# Patient Record
Sex: Female | Born: 1965 | Race: White | Hispanic: Yes | Marital: Married
Health system: Southern US, Community
[De-identification: ages and names within clinical notes are randomized; demographics above are authoritative.]

---

## 2005-11-15 ENCOUNTER — Ambulatory Visit: Payer: Self-pay | Admitting: *Deleted

## 2005-11-15 ENCOUNTER — Ambulatory Visit: Payer: Self-pay

## 2007-05-29 ENCOUNTER — Ambulatory Visit: Payer: Self-pay

## 2011-05-03 ENCOUNTER — Ambulatory Visit: Payer: Self-pay

## 2012-08-07 ENCOUNTER — Ambulatory Visit: Payer: Self-pay

## 2014-07-29 ENCOUNTER — Ambulatory Visit: Payer: Self-pay

## 2016-10-25 ENCOUNTER — Ambulatory Visit (INDEPENDENT_AMBULATORY_CARE_PROVIDER_SITE_OTHER): Payer: Self-pay | Admitting: Physician Assistant

## 2017-04-18 ENCOUNTER — Encounter (INDEPENDENT_AMBULATORY_CARE_PROVIDER_SITE_OTHER): Payer: Self-pay

## 2017-04-18 ENCOUNTER — Encounter: Payer: Self-pay | Admitting: *Deleted

## 2017-04-18 ENCOUNTER — Ambulatory Visit
Admission: RE | Admit: 2017-04-18 | Discharge: 2017-04-18 | Disposition: A | Discharge status: Home / Self Care | Payer: Self-pay | Source: Ambulatory Visit | Attending: Oncology | Admitting: Oncology

## 2017-04-18 ENCOUNTER — Ambulatory Visit: Payer: Self-pay | Attending: Oncology | Admitting: *Deleted

## 2017-04-18 VITALS — BP 127/85 | HR 58 | Temp 95.8°F | Resp 18 | Ht 59.45 in | Wt 141.6 lb

## 2017-04-18 DIAGNOSIS — Z0000 Encounter for general adult medical examination without abnormal findings: Secondary | ICD-10-CM

## 2017-04-18 NOTE — Progress Notes (Signed)
Subjective:     Patient ID: Wendy Mcmillan, female   DOB: [birth year], 51 y.o.   MRN: [id number]  HPI   Review of Systems     Objective:   Physical Exam  Pulmonary/Chest: Right breast exhibits no inverted nipple, no mass, no nipple discharge, no skin change and no tenderness. Left breast exhibits no inverted nipple, no mass, no nipple discharge, no skin change and no tenderness. Breasts are symmetrical.       Assessment:     51 year old English speaking Hispanic female returns to [facility] for annual screening.  Clinical breast exam unremarkable.  Taught self breast awareness.  Last pap was completed in August 2018 with her primary care provider.  Complains of menopausal symptoms of hot flashes.  Hand-out on menopause given to patient.  She is to try OTC Black Cohosh or Jeffie Pollock since she does not have a family history of breast cancer.  If the the hot flashes are really bad she was encourage to discuss with her primary provider about a very low dose of an antidepressant.  States they are not that bad at this time.  Patient has been screened for eligibility.  She does not have any insurance, Medicare or Medicaid.  She also meets financial eligibility.  Hand-out given on the Affordable Care Act.    Plan:     Screening mammogram ordered.  Will follow-up per [facility] protocol.

## 2017-04-18 NOTE — Patient Instructions (Signed)
Menopause Menopause is the normal time of life when menstrual periods stop completely. Menopause is complete when you have missed 12 consecutive menstrual periods. It usually occurs between the ages of 48 years and 55 years. Very rarely does a woman develop menopause before the age of 40 years. At menopause, your ovaries stop producing the female hormones estrogen and progesterone. This can cause undesirable symptoms and also affect your health. Sometimes the symptoms may occur 4-5 years before the menopause begins. There is no relationship between menopause and:  Oral contraceptives.  Number of children you had.  Race.  The age your menstrual periods started (menarche).  Heavy smokers and very thin women may develop menopause earlier in life. What are the causes?  The ovaries stop producing the female hormones estrogen and progesterone. Other causes include:  Surgery to remove both ovaries.  The ovaries stop functioning for no known reason.  Tumors of the pituitary gland in the brain.  Medical disease that affects the ovaries and hormone production.  Radiation treatment to the abdomen or pelvis.  Chemotherapy that affects the ovaries.  What are the signs or symptoms?  Hot flashes.  Night sweats.  Decrease in sex drive.  Vaginal dryness and thinning of the vagina causing painful intercourse.  Dryness of the skin and developing wrinkles.  Headaches.  Tiredness.  Irritability.  Memory problems.  Weight gain.  Bladder infections.  Hair growth of the face and chest.  Infertility. More serious symptoms include:  Loss of bone (osteoporosis) causing breaks (fractures).  Depression.  Hardening and narrowing of the arteries (atherosclerosis) causing heart attacks and strokes.  How is this diagnosed?  When the menstrual periods have stopped for 12 straight months.  Physical exam.  Hormone studies of the blood. How is this treated? There are many treatment  choices and nearly as many questions about them. The decisions to treat or not to treat menopausal changes is an individual choice made with your health care provider. Your health care provider can discuss the treatments with you. Together, you can decide which treatment will work best for you. Your treatment choices may include:  Hormone therapy (estrogen and progesterone).  Non-hormonal medicines.  Treating the individual symptoms with medicine (for example antidepressants for depression).  Herbal medicines that may help specific symptoms.  Counseling by a psychiatrist or psychologist.  Group therapy.  Lifestyle changes including: ? Eating healthy. ? Regular exercise. ? Limiting caffeine and alcohol. ? Stress management and meditation.  No treatment.  Follow these instructions at home:  Take the medicine your health care provider gives you as directed.  Get plenty of sleep and rest.  Exercise regularly.  Eat a diet that contains calcium (good for the bones) and soy products (acts like estrogen hormone).  Avoid alcoholic beverages.  Do not smoke.  If you have hot flashes, dress in layers.  Take supplements, calcium, and vitamin D to strengthen bones.  You can use over-the-counter lubricants or moisturizers for vaginal dryness.  Group therapy is sometimes very helpful.  Acupuncture may be helpful in some cases. Contact a health care provider if:  You are not sure you are in menopause.  You are having menopausal symptoms and need advice and treatment.  You are still having menstrual periods after age 55 years.  You have pain with intercourse.  Menopause is complete (no menstrual period for 12 months) and you develop vaginal bleeding.  You need a referral to a specialist (gynecologist, psychiatrist, or psychologist) for treatment. Get help right   away if:  You have severe depression.  You have excessive vaginal bleeding.  You fell and think you have a  broken bone.  You have pain when you urinate.  You develop leg or chest pain.  You have a fast pounding heart beat (palpitations).  You have severe headaches.  You develop vision problems.  You feel a lump in your breast.  You have abdominal pain or severe indigestion. This information is not intended to replace advice given to you by your health care provider. Make sure you discuss any questions you have with your health care provider. Document Released: 11/04/2003 Document Revised: 01/20/2016 Document Reviewed: 03/13/2013 Elsevier Interactive Patient Education  2017 [employer]  

## 2017-04-19 ENCOUNTER — Other Ambulatory Visit: Payer: Self-pay | Admitting: *Deleted

## 2017-04-19 DIAGNOSIS — N6489 Other specified disorders of breast: Secondary | ICD-10-CM

## 2017-05-03 ENCOUNTER — Ambulatory Visit
Admission: RE | Admit: 2017-05-03 | Discharge: 2017-05-03 | Disposition: A | Discharge status: Home / Self Care | Payer: Self-pay | Source: Ambulatory Visit | Attending: Oncology | Admitting: Oncology

## 2017-05-03 ENCOUNTER — Other Ambulatory Visit: Payer: Self-pay

## 2017-05-03 DIAGNOSIS — N63 Unspecified lump in unspecified breast: Secondary | ICD-10-CM

## 2017-05-03 DIAGNOSIS — N6489 Other specified disorders of breast: Secondary | ICD-10-CM

## 2017-05-04 ENCOUNTER — Other Ambulatory Visit: Payer: Self-pay

## 2017-05-04 DIAGNOSIS — N63 Unspecified lump in unspecified breast: Secondary | ICD-10-CM

## 2017-05-10 ENCOUNTER — Other Ambulatory Visit: Payer: Self-pay

## 2017-05-10 ENCOUNTER — Ambulatory Visit
Admission: RE | Admit: 2017-05-10 | Discharge: 2017-05-10 | Disposition: A | Discharge status: Home / Self Care | Payer: Self-pay | Source: Ambulatory Visit | Attending: Oncology | Admitting: Oncology

## 2017-05-10 DIAGNOSIS — R928 Other abnormal and inconclusive findings on diagnostic imaging of breast: Secondary | ICD-10-CM | POA: Insufficient documentation

## 2017-05-10 DIAGNOSIS — N63 Unspecified lump in unspecified breast: Secondary | ICD-10-CM

## 2017-05-10 DIAGNOSIS — N6311 Unspecified lump in the right breast, upper outer quadrant: Secondary | ICD-10-CM | POA: Insufficient documentation

## 2017-05-10 DIAGNOSIS — N6031 Fibrosclerosis of right breast: Secondary | ICD-10-CM | POA: Insufficient documentation

## 2017-05-10 HISTORY — PX: BREAST BIOPSY: SHX20

## 2017-05-11 LAB — SURGICAL PATHOLOGY

## 2017-05-14 ENCOUNTER — Encounter: Payer: Self-pay | Admitting: *Deleted

## 2017-05-14 NOTE — Progress Notes (Unsigned)
Benign biopsy noted.  Patient notified of results by radiology.  To follow up in one year with annual screening.  [facility] to [place].

## 2018-09-09 ENCOUNTER — Encounter (INDEPENDENT_AMBULATORY_CARE_PROVIDER_SITE_OTHER): Payer: Self-pay

## 2018-09-09 ENCOUNTER — Ambulatory Visit
Admission: RE | Admit: 2018-09-09 | Discharge: 2018-09-09 | Disposition: A | Discharge status: Home / Self Care | Payer: Self-pay | Source: Ambulatory Visit | Attending: Oncology | Admitting: Oncology

## 2018-09-09 ENCOUNTER — Encounter: Payer: Self-pay | Admitting: *Deleted

## 2018-09-09 ENCOUNTER — Ambulatory Visit: Payer: Self-pay | Attending: Oncology | Admitting: *Deleted

## 2018-09-09 VITALS — BP 129/86 | HR 73 | Temp 97.8°F | Ht 58.25 in | Wt 134.7 lb

## 2018-09-09 DIAGNOSIS — Z0000 Encounter for general adult medical examination without abnormal findings: Secondary | ICD-10-CM

## 2018-09-09 NOTE — Patient Instructions (Signed)
Gave patient hand-out, Women Staying Healthy, Active and Well from [facility], with education on breast health, pap smears, heart and colon health. 

## 2018-09-09 NOTE — Progress Notes (Signed)
  Subjective:     Patient ID: Wendy Mcmillan, female   DOB: [birth year], 53 y.o.   MRN: [id number]  HPI   Review of Systems     Objective:   Physical Exam Chest:     Breasts:        Right: Tenderness present. No swelling, bleeding, inverted nipple, mass, nipple discharge or skin change.        Left: No swelling, bleeding, inverted nipple, mass, nipple discharge, skin change or tenderness.     Comments: Right breast tender to palpation Lymphadenopathy:     Upper Body:     Right upper body: No supraclavicular or axillary adenopathy.     Left upper body: No supraclavicular or axillary adenopathy.        Assessment:     53 year old English speaking [place] female returns to [facility] for annual screening.  On clinical breast exam the right breast is tender to palpation.  There is no dominant mass, skin changes or nipple discharge.  Taught self breast awareness.  Last pap on 07/05/16 was negative / negative.  Next pap due in 2022.  Patient has been screened for eligibility.  She does not have any insurance, Medicare or Medicaid.  She also meets financial eligibility.  Hand-out given on the Affordable Care Act.  Risk Assessment    Risk Scores      09/09/2018   Last edited by: Alta Corning, CMA   5-year risk: 0.9 %   Lifetime risk: 7 %            Plan:     Screening mammogram ordered.  Will follow-up per [facility] protocol.

## 2018-09-10 ENCOUNTER — Encounter: Payer: Self-pay | Admitting: *Deleted

## 2018-09-10 NOTE — Progress Notes (Signed)
Letter mailed from the [facility] to inform patient of her normal mammogram results.  Patient is to follow-up with annual screening in one year.  [facility] to [place]. 

## 2018-11-09 IMAGING — MG MM DIGITAL SCREENING BILAT W/ TOMO W/ CAD
9 of 16 series · 9 of 32 positions shown · non-contrast
Comparison: Previous exam(s).

CLINICAL DATA: Screening.

EXAM:
2D DIGITAL SCREENING BILATERAL MAMMOGRAM WITH CAD AND ADJUNCT TOMO

[L MLO]
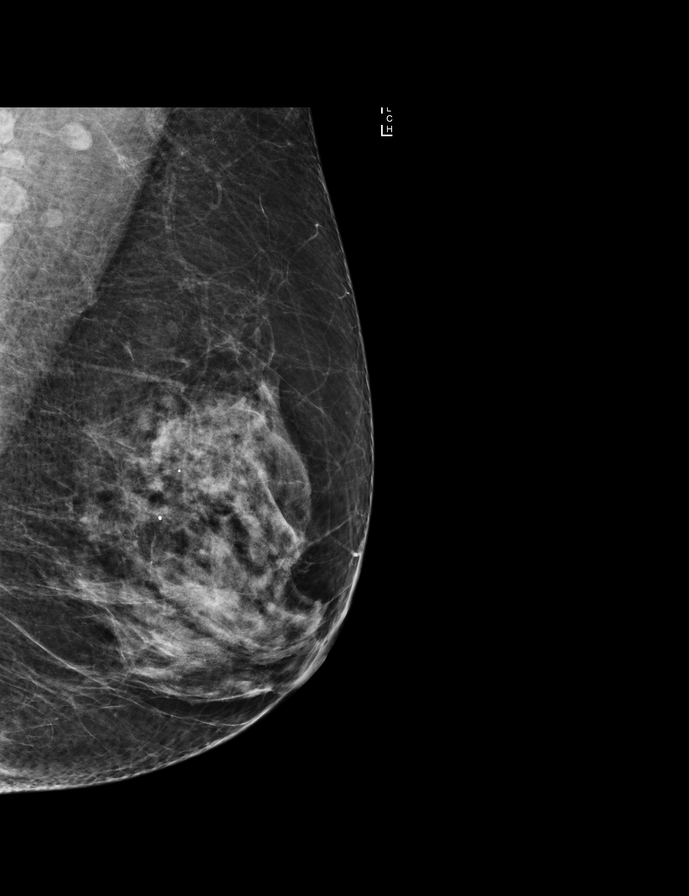

[R MLO (1 of 2)]
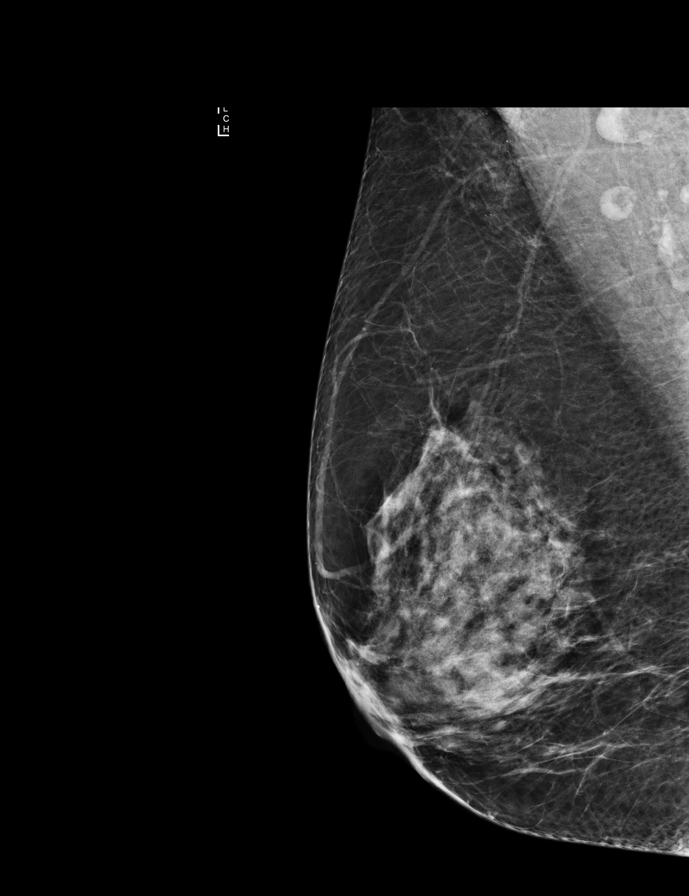

[R CC synth-2D]
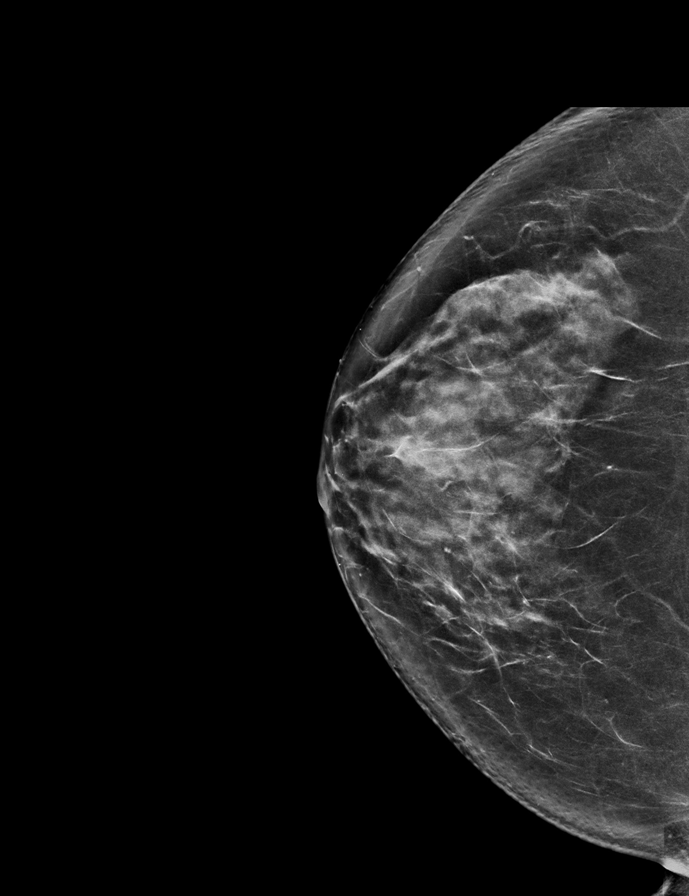

[L CC synth-2D]
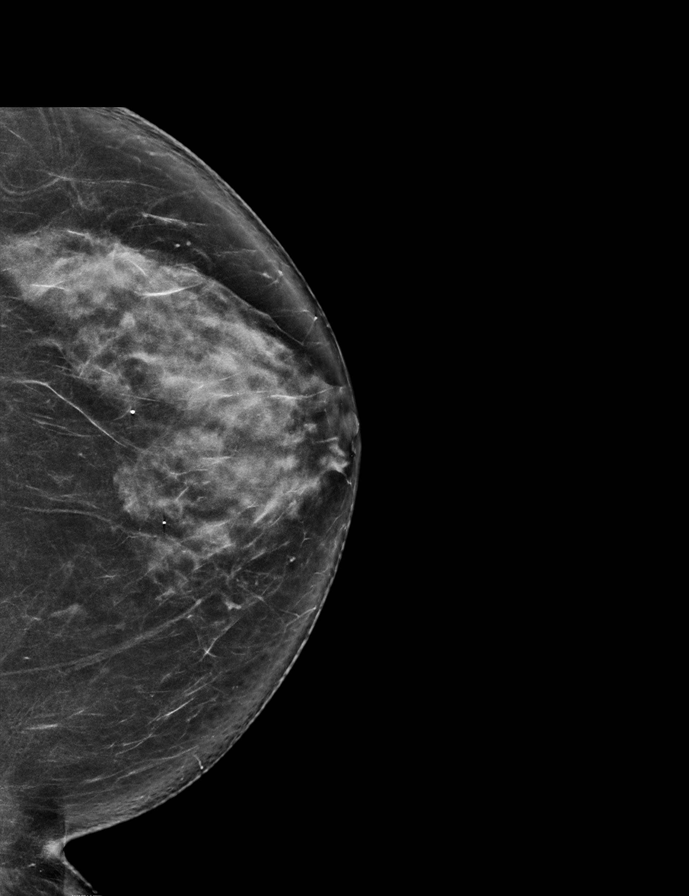

[R CC]
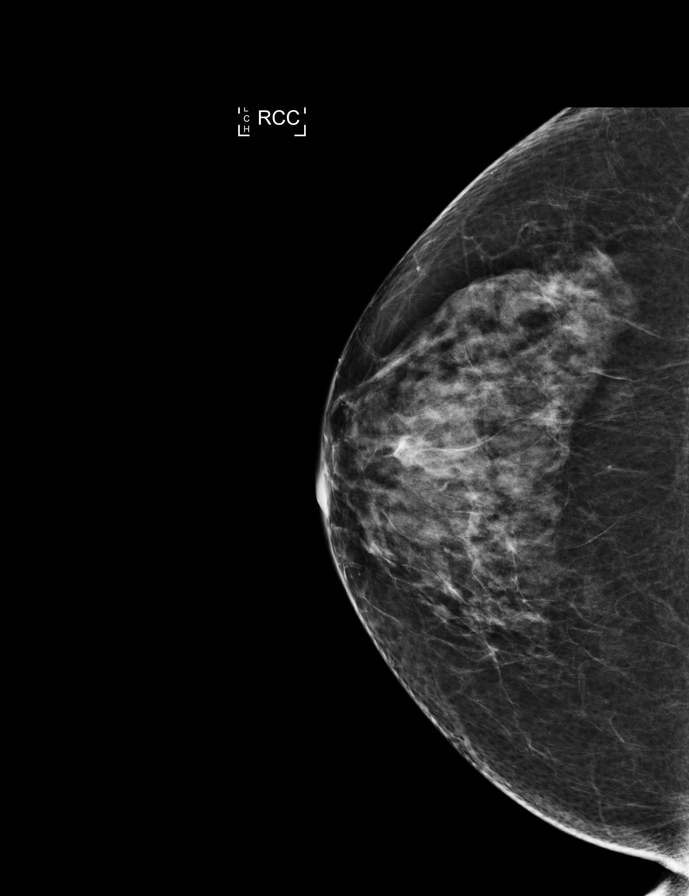

[R MLO synth-2D]
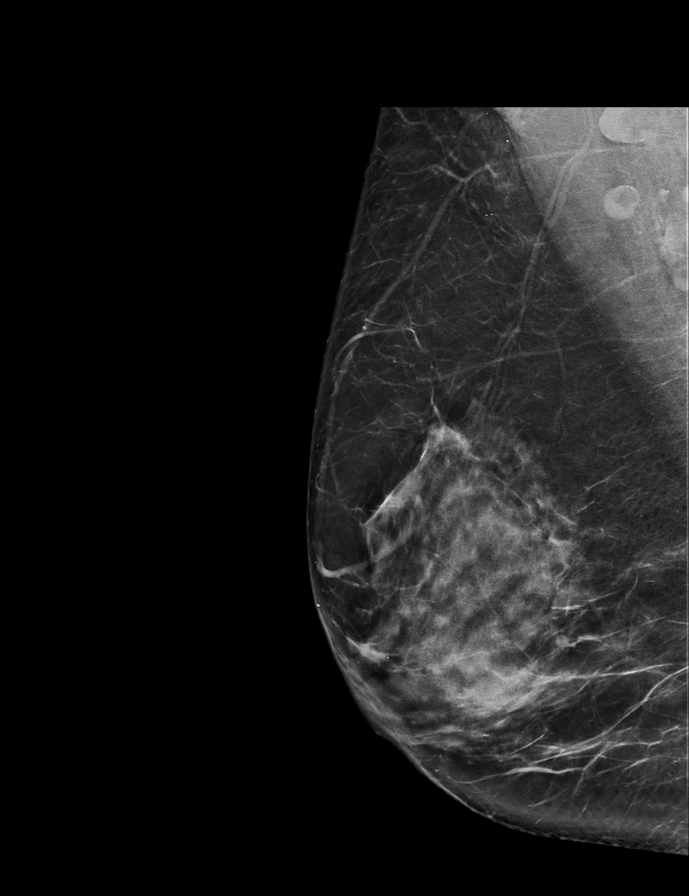

[L CC]
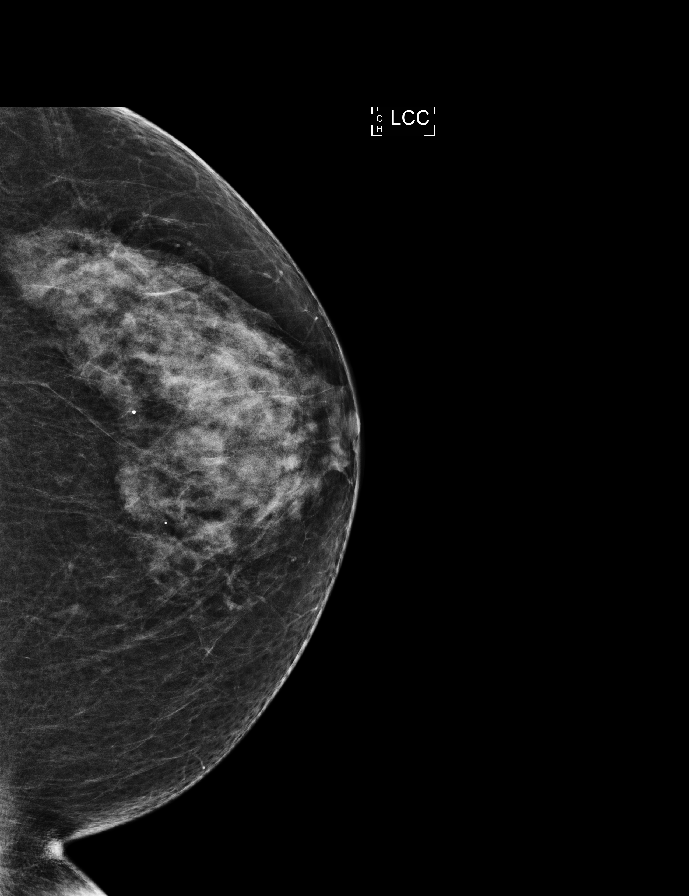

[L MLO synth-2D]
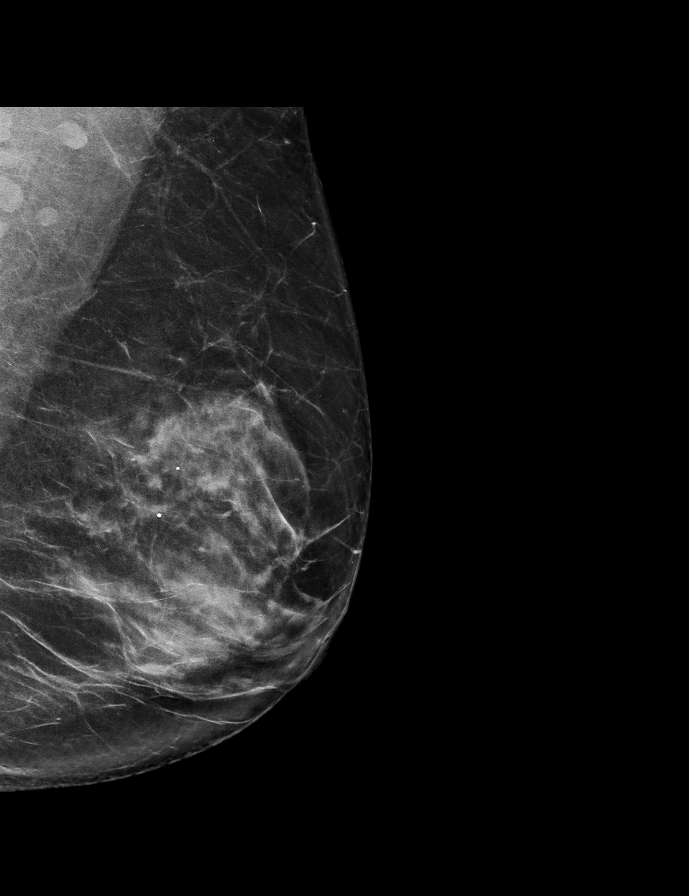

[R MLO (2 of 2)]
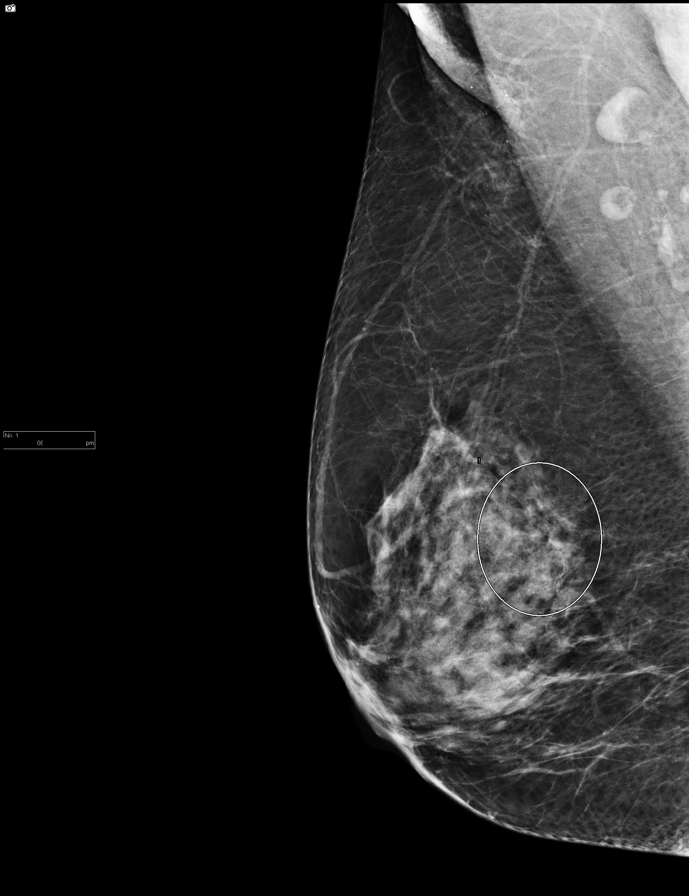

[9 of 32 positions shown; findings below may reference images not displayed]

ACR Breast Density Category c: The breast tissue is heterogeneously
dense, which may obscure small masses.
FINDINGS: In the right breast, possible distortion warrants further
evaluation. In the left breast, no findings suspicious for
malignancy. Images were processed with CAD.
IMPRESSION: Further evaluation is suggested for possible distortion in the right
breast.

RECOMMENDATION:
Diagnostic mammogram and possibly ultrasound of the right breast.
(Code:IM-5-LLG)

The patient will be contacted regarding the findings, and additional
imaging will be scheduled.

BI-RADS CATEGORY  0: Incomplete. Need additional imaging evaluation
and/or prior mammograms for comparison.

## 2018-11-24 IMAGING — US US BREAST*R* LIMITED INC AXILLA
1 series · 2 of 2 positions shown · non-contrast
Comparison: Previous exam(s).

CLINICAL DATA: Patient recalled from screening for right breast
distortion.

EXAM:
2D DIGITAL DIAGNOSTIC RIGHT MAMMOGRAM WITH CAD AND ADJUNCT TOMO
ULTRASOUND RIGHT BREAST

[Series 1: us breast*right* limited inc axilla · 0.07mm/px · 2 of 2 slices shown]
[im 1/2]
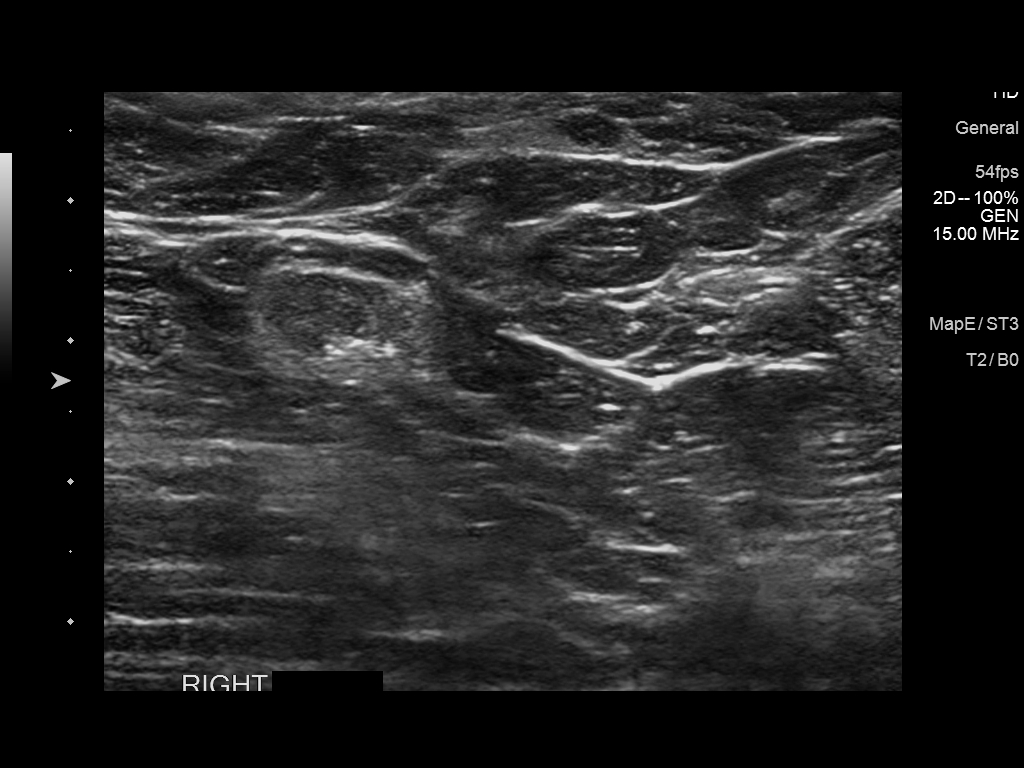
[im 2/2]
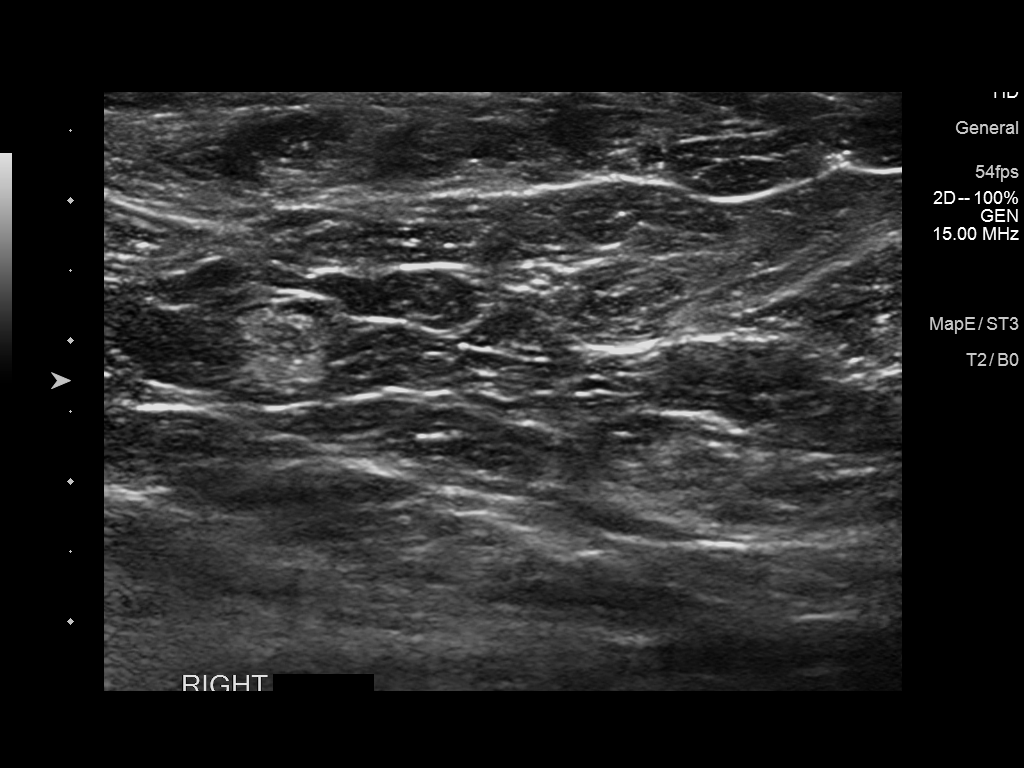

[2 of 2 positions shown; findings below may reference images not displayed]

ACR Breast Density Category c: The breast tissue is heterogeneously
dense, which may obscure small masses.
FINDINGS: Spot compression tomosynthesis CC and MLO views of the right breast
demonstrate persistent distortion within the upper-outer right
breast posterior depth.

Mammographic images were processed with CAD.

Targeted ultrasound is performed, showing no definite correlate for
focal distortion within the upper-outer right breast posterior
depth. No right axillary adenopathy.
IMPRESSION: Persistent focal distortion upper outer right breast. No sonographic
correlate identified.

RECOMMENDATION:
3D guided stereotactic biopsy focal distortion upper outer right
breast.

I have discussed the findings and recommendations with the patient.
Results were also provided in writing at the conclusion of the
visit. If applicable, a reminder letter will be sent to the patient
regarding the next appointment.

BI-RADS CATEGORY  4: Suspicious.

## 2019-11-04 NOTE — Progress Notes (Signed)
Patient pre-screened for [facility] eligibility. Two patient identifiers used for verification that I was speaking to correct patient.  Patient to Present directly to [facility] 11/05/19 for [facility] screening mammogram, and pap.

## 2019-11-05 ENCOUNTER — Other Ambulatory Visit: Payer: Self-pay

## 2019-11-05 ENCOUNTER — Ambulatory Visit
Admission: RE | Admit: 2019-11-05 | Discharge: 2019-11-05 | Disposition: A | Discharge status: Home / Self Care | Payer: Self-pay | Source: Ambulatory Visit | Attending: Oncology | Admitting: Oncology

## 2019-11-05 ENCOUNTER — Ambulatory Visit: Payer: Self-pay | Attending: Oncology

## 2019-11-05 VITALS — BP 145/101 | HR 93 | Temp 96.1°F | Ht <= 58 in | Wt 143.2 lb

## 2019-11-05 DIAGNOSIS — Z0000 Encounter for general adult medical examination without abnormal findings: Secondary | ICD-10-CM

## 2019-11-05 NOTE — Progress Notes (Signed)
  Subjective:     Patient ID: Wendy Mcmillan, female   DOB: [birth year], 54 y.o.   MRN: [id number]  HPI   Review of Systems     Objective:   Physical Exam Chest:     Breasts:        Right: No swelling, bleeding, inverted nipple, mass, nipple discharge, skin change or tenderness.        Comments: Biopsy scar right; right axillary tissue greater than left       Assessment:     54 year old Hispanic patient  presents for [facility] clinic visit.  Fluent in [place].  Patient screened, and meets [facility] eligibility.  Patient does not have insurance, Medicare or Medicaid. Instructed patient on breast self awareness using teach back method. Clinical breast exam unremarkable.Patient reports tissue under right arm feels like more than on left.  She reports a > 10 pound weight gain. No lymphadenopathy or mass palpated. Pelvic exam normal. Discussed care for vaginal dryness.  Risk Assessment    Risk Scores      11/05/2019 09/09/2018   Last edited by: Scarlett Presto, RN [place], Freada Bergeron, CMA   5-year risk: 1 % 0.9 %   Lifetime risk: 6.8 % 7 %             Plan:     Sent for bilateral screening mammogram. Specimen collected for pap.

## 2019-11-12 LAB — IGP, APTIMA HPV: HPV Aptima: NEGATIVE

## 2019-11-17 NOTE — Progress Notes (Signed)
Letter mailed to patient to notify of normal mammogram, and pap smear results.  Copy to [facility]IS. 

## 2020-06-16 ENCOUNTER — Other Ambulatory Visit: Payer: Self-pay

## 2020-06-16 DIAGNOSIS — N63 Unspecified lump in unspecified breast: Secondary | ICD-10-CM

## 2020-06-16 NOTE — Progress Notes (Signed)
Patient is feeling  right axillary mass where fatty tissue was noted in March 2021.  States she has noticed x 2 months.  She had COVID booster yesterday, but Asher Muir B[place]in said okay to schedule diagnostic mammogram, and ultrasound.  Scheduled imaging for 06/24/20 at 11:00.  Left message with appointment information.

## 2020-06-24 ENCOUNTER — Ambulatory Visit
Admission: RE | Admit: 2020-06-24 | Discharge: 2020-06-24 | Disposition: A | Discharge status: Home / Self Care | Payer: Self-pay | Source: Ambulatory Visit | Attending: Oncology | Admitting: Oncology

## 2020-06-24 ENCOUNTER — Other Ambulatory Visit: Payer: Self-pay

## 2020-06-24 DIAGNOSIS — N63 Unspecified lump in unspecified breast: Secondary | ICD-10-CM | POA: Insufficient documentation
